# Patient Record
Sex: Female | Born: 1965 | Race: Asian | Hispanic: No | Marital: Single
Health system: Southern US, Community
[De-identification: ages and names within clinical notes are randomized; demographics above are authoritative.]

## PROBLEM LIST (undated history)

## (undated) DIAGNOSIS — I10 Essential (primary) hypertension: Secondary | ICD-10-CM

## (undated) DIAGNOSIS — G8929 Other chronic pain: Secondary | ICD-10-CM

## (undated) DIAGNOSIS — R109 Unspecified abdominal pain: Secondary | ICD-10-CM

---

## 2009-05-04 ENCOUNTER — Emergency Department: Payer: Self-pay | Admitting: Internal Medicine

## 2011-01-05 ENCOUNTER — Ambulatory Visit: Payer: Self-pay | Admitting: Internal Medicine

## 2011-01-16 ENCOUNTER — Ambulatory Visit: Payer: Self-pay | Admitting: Internal Medicine

## 2013-08-31 ENCOUNTER — Ambulatory Visit: Payer: Self-pay | Admitting: Family Medicine

## 2015-01-21 ENCOUNTER — Ambulatory Visit: Payer: Self-pay | Admitting: Family Medicine

## 2016-02-01 ENCOUNTER — Other Ambulatory Visit: Payer: Self-pay | Admitting: Family Medicine

## 2016-02-01 DIAGNOSIS — Z1231 Encounter for screening mammogram for malignant neoplasm of breast: Secondary | ICD-10-CM

## 2016-02-13 ENCOUNTER — Ambulatory Visit
Admission: RE | Admit: 2016-02-13 | Discharge: 2016-02-13 | Disposition: A | Discharge status: Home / Self Care | Payer: BLUE CROSS/BLUE SHIELD | Source: Ambulatory Visit | Attending: Family Medicine | Admitting: Family Medicine

## 2016-02-13 DIAGNOSIS — Z1231 Encounter for screening mammogram for malignant neoplasm of breast: Secondary | ICD-10-CM | POA: Diagnosis not present

## 2016-07-27 ENCOUNTER — Ambulatory Visit: Payer: BLUE CROSS/BLUE SHIELD | Admitting: Certified Registered Nurse Anesthetist

## 2016-07-27 ENCOUNTER — Encounter
Admission: RE | Disposition: A | Discharge status: Home / Self Care | Payer: Self-pay | Source: Ambulatory Visit | Attending: Gastroenterology

## 2016-07-27 ENCOUNTER — Encounter: Payer: Self-pay | Admitting: Anesthesiology

## 2016-07-27 ENCOUNTER — Ambulatory Visit
Admission: RE | Admit: 2016-07-27 | Discharge: 2016-07-27 | Disposition: A | Discharge status: Home / Self Care | Payer: BLUE CROSS/BLUE SHIELD | Source: Ambulatory Visit | Attending: Gastroenterology | Admitting: Gastroenterology

## 2016-07-27 DIAGNOSIS — I10 Essential (primary) hypertension: Secondary | ICD-10-CM | POA: Insufficient documentation

## 2016-07-27 DIAGNOSIS — Z1211 Encounter for screening for malignant neoplasm of colon: Secondary | ICD-10-CM | POA: Diagnosis not present

## 2016-07-27 DIAGNOSIS — K640 First degree hemorrhoids: Secondary | ICD-10-CM | POA: Diagnosis not present

## 2016-07-27 HISTORY — DX: Essential (primary) hypertension: I10

## 2016-07-27 HISTORY — DX: Other chronic pain: G89.29

## 2016-07-27 HISTORY — DX: Unspecified abdominal pain: R10.9

## 2016-07-27 HISTORY — PX: COLONOSCOPY WITH PROPOFOL: SHX5780

## 2016-07-27 SURGERY — COLONOSCOPY WITH PROPOFOL
Anesthesia: General

## 2016-07-27 MED ORDER — SODIUM CHLORIDE 0.9 % IV SOLN
INTRAVENOUS | Status: DC
  Administered 2016-07-27: 1000 mL via INTRAVENOUS

## 2016-07-27 MED ORDER — PROPOFOL 10 MG/ML IV BOLUS
INTRAVENOUS | Status: DC | PRN
  Administered 2016-07-27: 25 mg via INTRAVENOUS

## 2016-07-27 MED ORDER — MIDAZOLAM HCL 5 MG/5ML IJ SOLN
INTRAMUSCULAR | Status: DC | PRN
  Administered 2016-07-27: 1 mg via INTRAVENOUS

## 2016-07-27 MED ORDER — PROPOFOL 500 MG/50ML IV EMUL
INTRAVENOUS | Status: DC | PRN
  Administered 2016-07-27: 140 ug/kg/min via INTRAVENOUS

## 2016-07-27 MED ORDER — SODIUM CHLORIDE 0.9 % IV SOLN: INTRAVENOUS | Status: DC

## 2016-07-27 MED ORDER — FENTANYL CITRATE (PF) 100 MCG/2ML IJ SOLN
INTRAMUSCULAR | Status: DC | PRN
  Administered 2016-07-27: 50 ug via INTRAVENOUS

## 2016-07-27 NOTE — OR Nursing (Signed)
All pre-procedure questions and explanations were done with Simmaly Sinouanthavysouk, Sports coachLaotian Interpreter.

## 2016-07-27 NOTE — Anesthesia Preprocedure Evaluation (Signed)
Anesthesia Evaluation  Patient identified by MRN, date of birth, ID band Patient awake    Reviewed: Allergy & Precautions, H&P , NPO status , Patient's Chart, lab work & pertinent test results, reviewed documented beta blocker date and time   History of Anesthesia Complications Negative for: history of anesthetic complications  Airway Mallampati: I  TM Distance: >3 FB Neck ROM: full    Dental no notable dental hx. (+) Missing   Pulmonary neg pulmonary ROS,    Pulmonary exam normal breath sounds clear to auscultation       Cardiovascular Exercise Tolerance: Good hypertension, (-) angina(-) CAD, (-) Past MI, (-) Cardiac Stents and (-) CABG Normal cardiovascular exam(-) dysrhythmias (-) Valvular Problems/Murmurs Rhythm:regular Rate:Normal     Neuro/Psych negative neurological ROS  negative psych ROS   GI/Hepatic negative GI ROS, Neg liver ROS,   Endo/Other  negative endocrine ROS  Renal/GU negative Renal ROS  negative genitourinary   Musculoskeletal   Abdominal   Peds  Hematology negative hematology ROS (+)   Anesthesia Other Findings Past Medical History: No date: Chronic abdominal pain No date: Hypertension   Reproductive/Obstetrics negative OB ROS                             Anesthesia Physical Anesthesia Plan  ASA: II  Anesthesia Plan: General   Post-op Pain Management:    Induction:   Airway Management Planned:   Additional Equipment:   Intra-op Plan:   Post-operative Plan:   Informed Consent: I have reviewed the patients History and Physical, chart, labs and discussed the procedure including the risks, benefits and alternatives for the proposed anesthesia with the patient or authorized representative who has indicated his/her understanding and acceptance.   Dental Advisory Given  Plan Discussed with: Anesthesiologist, CRNA and Surgeon  Anesthesia Plan Comments:          Anesthesia Quick Evaluation

## 2016-07-27 NOTE — Op Note (Signed)
[facility] Gastroenterology Patient Name: Lauren Peterson Procedure Date: 07/27/2016 8:34 AM MRN: [id number] Account #: [id number] Date of Birth: 03-20-1966 Admit Type: Outpatient Age: 50 Room: [facility] ENDO ROOM 3 Gender: Female Note Status: Finalized Procedure:            Colonoscopy Indications:          Screening for colorectal malignant neoplasm, This is                        the patient's first colonoscopy Providers:            Christena Deem, MD Referring MD:         Marisue Ivan (Referring MD) Medicines:            Monitored Anesthesia Care Complications:        No immediate complications. Procedure:            Pre-Anesthesia Assessment:                       - ASA Grade Assessment: II - A patient with mild                        systemic disease.                       After obtaining informed consent, the colonoscope was                        passed under direct vision. Throughout the procedure,                        the patient's blood pressure, pulse, and oxygen                        saturations were monitored continuously. The                        Colonoscope was introduced through the anus and                        advanced to the the cecum, identified by appendiceal                        orifice and ileocecal valve. The colonoscopy was                        performed without difficulty. The patient tolerated the                        procedure well. The quality of the bowel preparation                        was good. Findings:      Non-bleeding internal hemorrhoids were found during anoscopy. The       hemorrhoids were small and Grade I (internal hemorrhoids that do not       prolapse).      The digital rectal exam was normal.      The entire examined colon appeared normal. Impression:           - Non-bleeding internal hemorrhoids.                       -  The entire examined colon is normal.                       - No  specimens collected. Recommendation:       - Repeat colonoscopy in 10 years for screening purposes. Procedure Code(s):    --- Professional ---                       [phone], Colonoscopy, flexible; diagnostic, including                        collection of specimen(s) by brushing or washing, when                        performed (separate procedure) Diagnosis Code(s):    --- Professional ---                       Z12.11, Encounter for screening for malignant neoplasm                        of colon                       K64.0, First degree hemorrhoids CPT copyright 2016 [employer]. All rights reserved. The codes documented in this report are preliminary and upon coder review may  be revised to meet current compliance requirements. Christena DeemMartin U Augustino Savastano, MD 07/27/2016 9:04:19 AM This report has been signed electronically. Number of Addenda: 0 Note Initiated On: 07/27/2016 8:34 AM Scope Withdrawal Time: 0 hours 5 minutes 54 seconds  Total Procedure Duration: 0 hours 12 minutes 12 seconds       [facility]

## 2016-07-27 NOTE — Transfer of Care (Signed)
Immediate Anesthesia Transfer of Care Note  Patient: Lauren Peterson  Procedure(s) Performed: Procedure(s): COLONOSCOPY WITH PROPOFOL (N/A)  Patient Location: PACU and Endoscopy Unit  Anesthesia Type:General  Level of Consciousness: awake, alert  and oriented  Airway & Oxygen Therapy: Patient Spontanous Breathing and Patient connected to nasal cannula oxygen  Post-op Assessment:   VSS, NAD, no anesthesia problems.  Post vital signs: Reviewed and stable  Last Vitals:  Vitals:   07/27/16 0749 07/27/16 0907  BP: (!) 134/94   Pulse: 79   Resp: 16   Temp: 36.5 C (!) 35.6 C    Last Pain:  Vitals:   07/27/16 0907  TempSrc: Tympanic         Complications: No apparent anesthesia complications

## 2016-07-27 NOTE — H&P (Signed)
Outpatient short stay form Pre-procedure 07/27/2016 8:34 AM Lauren DeemMartin U Skulskie MD  Primary Physician: Dr Lauren Peterson  Reason for visit:  Colon cancer screening  History of present illness:  Patient is a 50 year old female presenting as above. She tolerated her prep well. She takes no aspirin or blood thinning agents. She has no baseline symptoms today of abdominal pain. She has no diarrhea or rectal bleeding.    Current Facility-Administered Medications:  .  0.9 %  sodium chloride infusion, , Intravenous, Continuous, Lauren DeemMartin U Skulskie, MD, Last Rate: 20 mL/hr at 07/27/16 0831, 1,000 mL at 07/27/16 0831 .  0.9 %  sodium chloride infusion, , Intravenous, Continuous, Lauren DeemMartin U Skulskie, MD  Prescriptions Prior to Admission  Medication Sig Dispense Refill Last Dose  . hydrochlorothiazide (HYDRODIURIL) 25 MG tablet Take 25 mg by mouth daily.   07/27/2016 at 0555  . ibuprofen (ADVIL,MOTRIN) 200 MG tablet Take 200 mg by mouth every 6 (six) hours as needed.   Not Taking at Unknown time     No Known Allergies   Past Medical History:  Diagnosis Date  . Chronic abdominal pain   . Hypertension     Review of systems:      Physical Exam    Heart and lungs: Regular rate and rhythm without rub or gallop, lungs are bilaterally clear.    HEENT: Normocephalic atraumatic eyes are anicteric    Other:     Pertinant exam for procedure: Soft nontender nondistended bowel sounds positive normoactive.    Planned proceedures: Colonoscopy and indicated procedures. I have discussed the risks benefits and complications of procedures to include not limited to bleeding, infection, perforation and the risk of sedation and the patient wishes to proceed.    Lauren DeemMartin U Skulskie, MD Gastroenterology 07/27/2016  8:34 AM

## 2016-07-27 NOTE — Anesthesia Postprocedure Evaluation (Signed)
Anesthesia Post Note  Patient: Lauren Peterson  Procedure(s) Performed: Procedure(s) (LRB): COLONOSCOPY WITH PROPOFOL (N/A)  Patient location during evaluation: Endoscopy Anesthesia Type: General Level of consciousness: awake and alert Pain management: pain level controlled Vital Signs Assessment: post-procedure vital signs reviewed and stable Respiratory status: spontaneous breathing, nonlabored ventilation, respiratory function stable and patient connected to nasal cannula oxygen Cardiovascular status: blood pressure returned to baseline and stable Postop Assessment: no signs of nausea or vomiting Anesthetic complications: no    Last Vitals:  Vitals:   07/27/16 0927 07/27/16 0937  BP: (!) 110/97 (!) 154/97  Pulse: 67 64  Resp: 13 14  Temp:      Last Pain:  Vitals:   07/27/16 0907  TempSrc: Tympanic                 Lenard SimmerAndrew Tyrianna Lightle

## 2016-07-27 NOTE — Anesthesia Procedure Notes (Signed)
Date/Time: 07/27/2016 8:47 AM Performed by: Derinda LateIACONE, Jesseka Drinkard Pre-anesthesia Checklist: Patient identified, Emergency Drugs available, Suction available, Patient being monitored and Timeout performed Patient Re-evaluated:Patient Re-evaluated prior to inductionOxygen Delivery Method: Nasal cannula Preoxygenation: Pre-oxygenation with 100% oxygen Intubation Type: IV induction Placement Confirmation: positive ETCO2

## 2016-07-30 ENCOUNTER — Encounter: Payer: Self-pay | Admitting: Gastroenterology

## 2017-03-21 ENCOUNTER — Other Ambulatory Visit: Payer: Self-pay | Admitting: Family Medicine

## 2017-03-21 DIAGNOSIS — Z1231 Encounter for screening mammogram for malignant neoplasm of breast: Secondary | ICD-10-CM

## 2017-04-10 ENCOUNTER — Ambulatory Visit: Payer: BLUE CROSS/BLUE SHIELD

## 2017-05-20 ENCOUNTER — Ambulatory Visit
Admission: RE | Admit: 2017-05-20 | Discharge: 2017-05-20 | Disposition: A | Discharge status: Home / Self Care | Payer: BLUE CROSS/BLUE SHIELD | Source: Ambulatory Visit | Attending: Family Medicine | Admitting: Family Medicine

## 2017-05-20 DIAGNOSIS — Z1231 Encounter for screening mammogram for malignant neoplasm of breast: Secondary | ICD-10-CM | POA: Diagnosis present

## 2017-09-18 IMAGING — MG MM DIGITAL SCREENING BILAT W/ CAD
4 series · 4 of 4 positions shown · non-contrast
Comparison: Previous exam(s).

CLINICAL DATA: Screening.

EXAM:
DIGITAL SCREENING BILATERAL MAMMOGRAM WITH CAD

[R MLO]
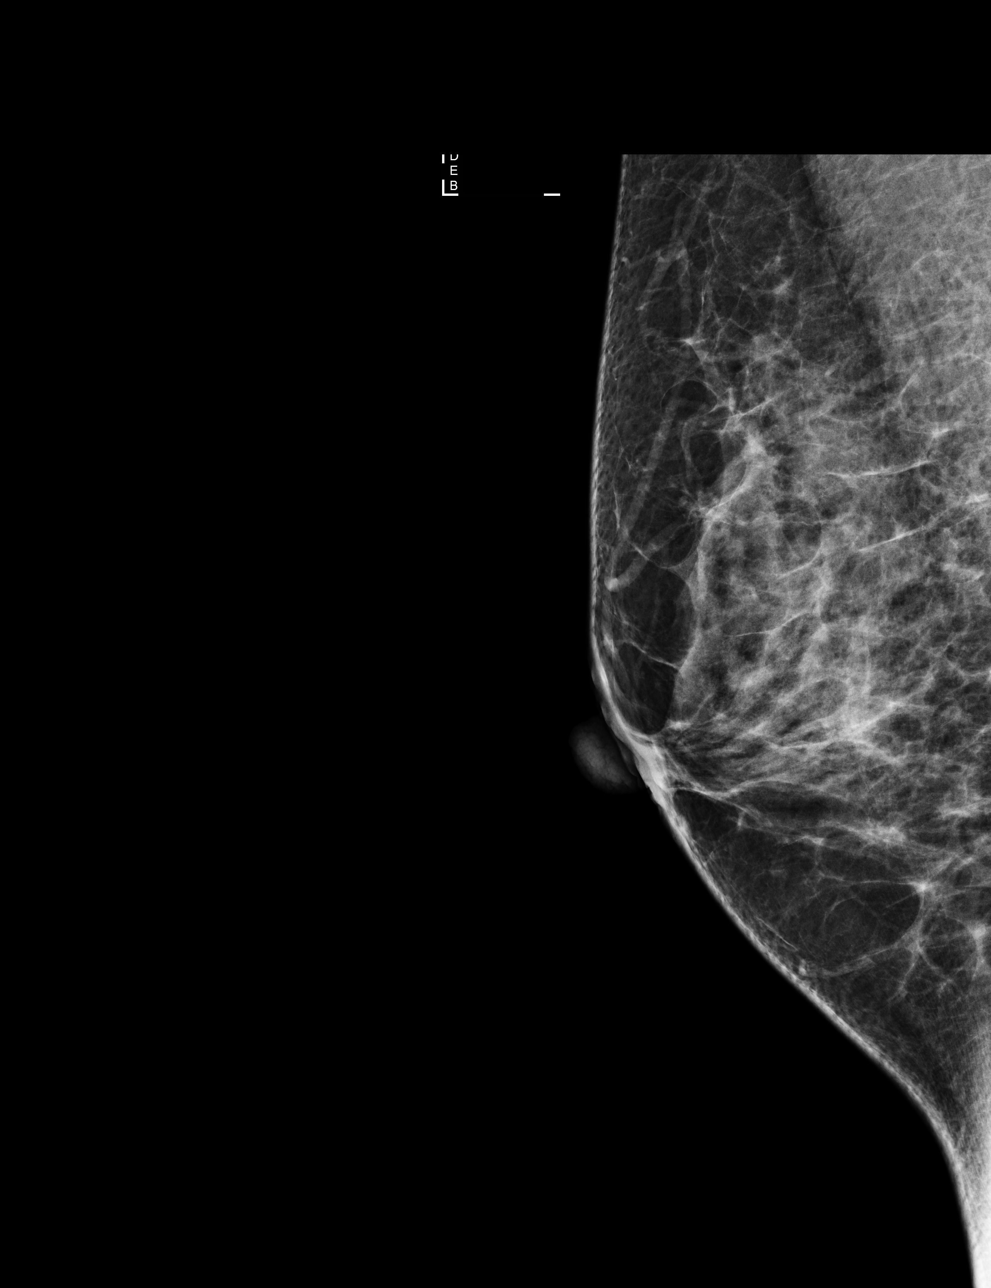

[R CC]
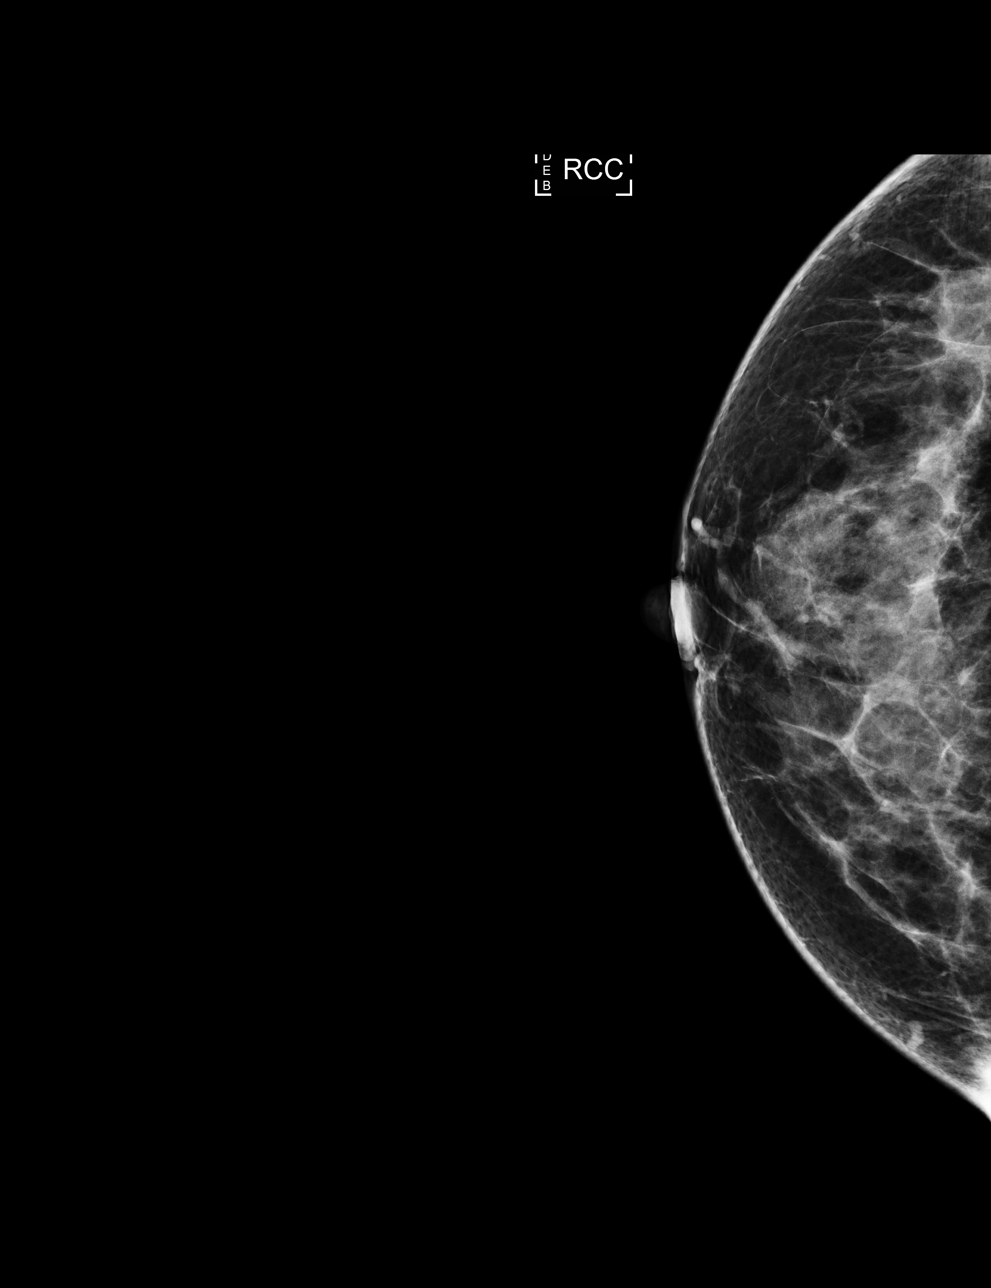

[L CC]
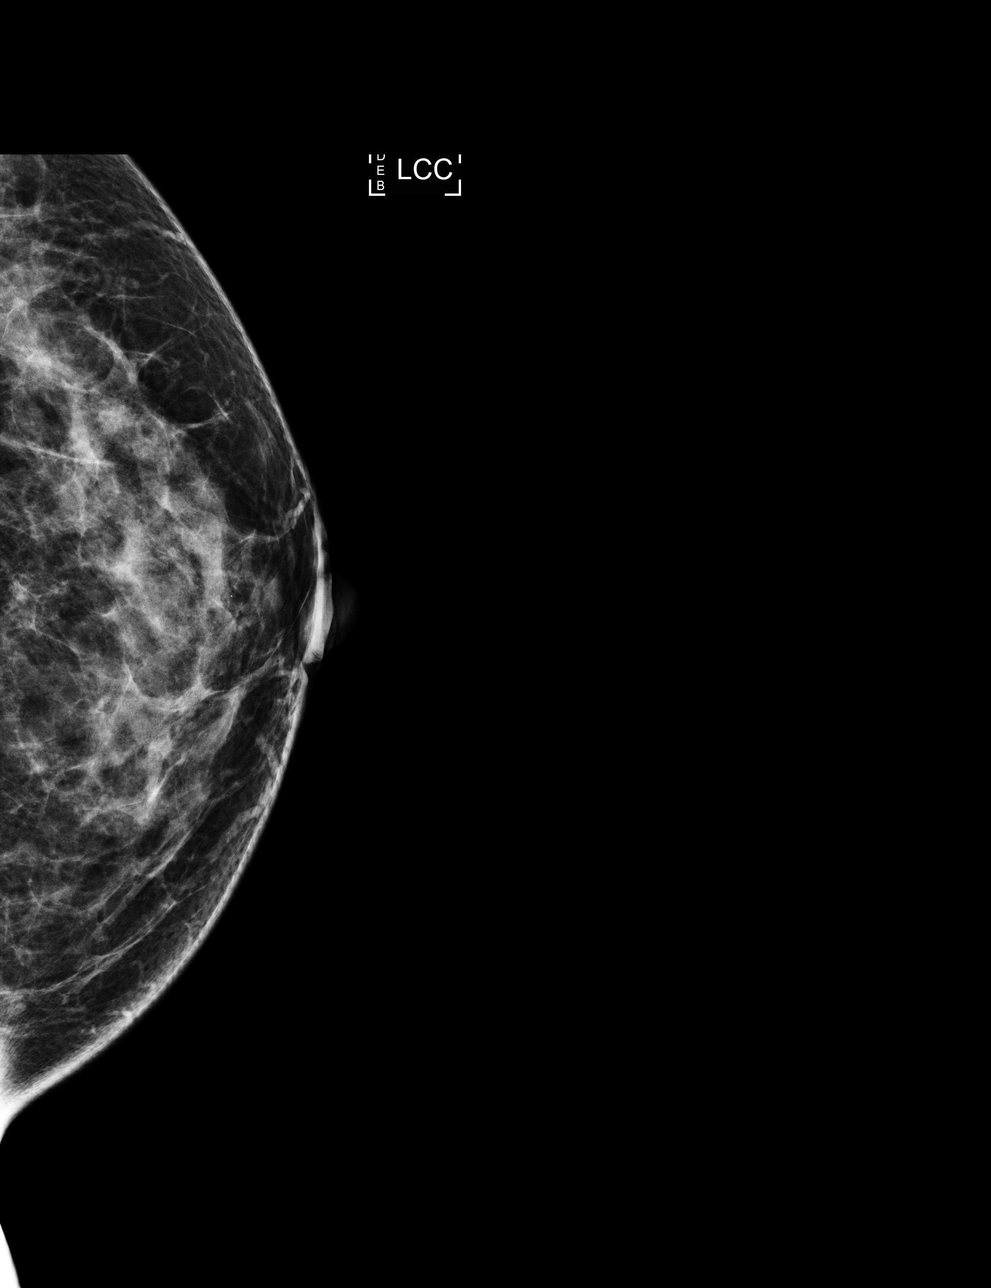

[L MLO]
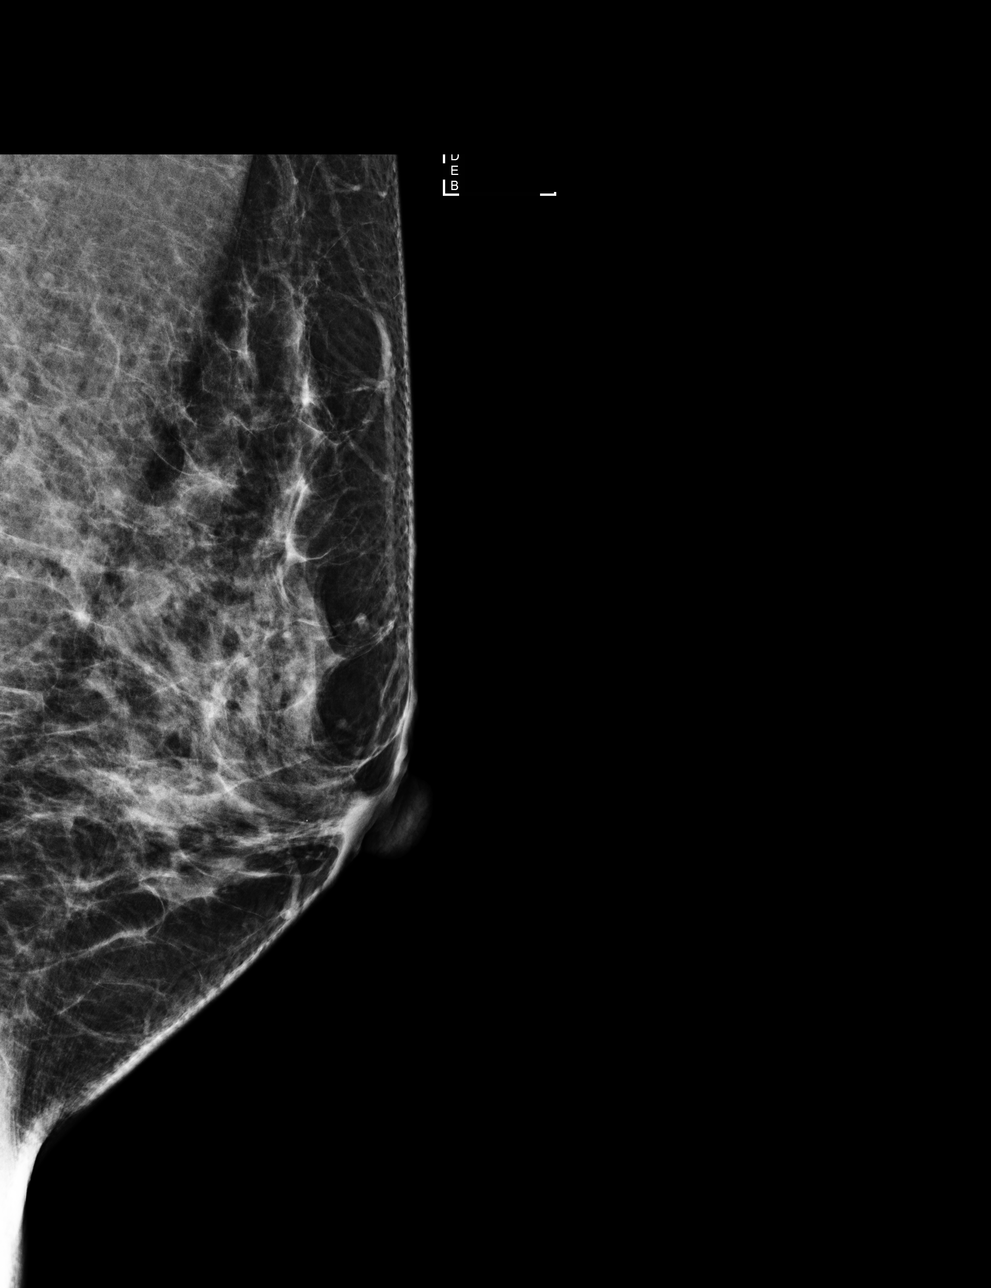

[4 of 4 positions shown; findings below may reference images not displayed]

ACR Breast Density Category c: The breast tissue is heterogeneously
dense, which may obscure small masses.
FINDINGS: There are no findings suspicious for malignancy. Images were
processed with CAD.
IMPRESSION: No mammographic evidence of malignancy. A result letter of this
screening mammogram will be mailed directly to the patient.

RECOMMENDATION:
Screening mammogram in one year. (Code:[id number])

BI-RADS CATEGORY  1: Negative.

## 2018-05-21 ENCOUNTER — Other Ambulatory Visit: Payer: Self-pay | Admitting: Family Medicine

## 2018-05-21 DIAGNOSIS — Z1231 Encounter for screening mammogram for malignant neoplasm of breast: Secondary | ICD-10-CM

## 2018-06-10 ENCOUNTER — Ambulatory Visit
Admission: RE | Admit: 2018-06-10 | Discharge: 2018-06-10 | Disposition: A | Discharge status: Home / Self Care | Payer: BLUE CROSS/BLUE SHIELD | Source: Ambulatory Visit | Attending: Family Medicine | Admitting: Family Medicine

## 2018-06-10 DIAGNOSIS — Z1231 Encounter for screening mammogram for malignant neoplasm of breast: Secondary | ICD-10-CM | POA: Insufficient documentation

## 2019-06-02 ENCOUNTER — Other Ambulatory Visit: Payer: Self-pay | Admitting: Family Medicine

## 2019-06-02 DIAGNOSIS — Z1231 Encounter for screening mammogram for malignant neoplasm of breast: Secondary | ICD-10-CM

## 2019-07-06 ENCOUNTER — Ambulatory Visit
Admission: RE | Admit: 2019-07-06 | Discharge: 2019-07-06 | Disposition: A | Discharge status: Home / Self Care | Payer: BC Managed Care – PPO | Source: Ambulatory Visit | Attending: Family Medicine | Admitting: Family Medicine

## 2019-07-06 DIAGNOSIS — Z1231 Encounter for screening mammogram for malignant neoplasm of breast: Secondary | ICD-10-CM | POA: Insufficient documentation

## 2020-07-26 ENCOUNTER — Other Ambulatory Visit: Payer: Self-pay | Admitting: Family Medicine

## 2020-07-26 DIAGNOSIS — Z1231 Encounter for screening mammogram for malignant neoplasm of breast: Secondary | ICD-10-CM

## 2020-08-18 ENCOUNTER — Ambulatory Visit
Admission: RE | Admit: 2020-08-18 | Discharge: 2020-08-18 | Disposition: A | Discharge status: Home / Self Care | Payer: BC Managed Care – PPO | Source: Ambulatory Visit | Attending: Family Medicine | Admitting: Family Medicine

## 2020-08-18 ENCOUNTER — Other Ambulatory Visit: Payer: Self-pay

## 2020-08-18 DIAGNOSIS — Z1231 Encounter for screening mammogram for malignant neoplasm of breast: Secondary | ICD-10-CM | POA: Insufficient documentation

## 2021-07-19 ENCOUNTER — Other Ambulatory Visit: Payer: Self-pay | Admitting: Family Medicine

## 2021-07-19 DIAGNOSIS — Z1231 Encounter for screening mammogram for malignant neoplasm of breast: Secondary | ICD-10-CM

## 2021-08-29 ENCOUNTER — Ambulatory Visit
Admission: RE | Admit: 2021-08-29 | Discharge: 2021-08-29 | Disposition: A | Discharge status: Home / Self Care | Payer: BC Managed Care – PPO | Source: Ambulatory Visit | Attending: Family Medicine | Admitting: Family Medicine

## 2021-08-29 ENCOUNTER — Other Ambulatory Visit: Payer: Self-pay

## 2021-08-29 DIAGNOSIS — Z1231 Encounter for screening mammogram for malignant neoplasm of breast: Secondary | ICD-10-CM | POA: Diagnosis present

## 2021-12-28 ENCOUNTER — Other Ambulatory Visit: Payer: Self-pay

## 2021-12-28 ENCOUNTER — Other Ambulatory Visit: Payer: Self-pay | Admitting: Family Medicine

## 2021-12-28 ENCOUNTER — Ambulatory Visit
Admission: RE | Admit: 2021-12-28 | Discharge: 2021-12-28 | Disposition: A | Discharge status: Home / Self Care | Payer: BC Managed Care – PPO | Source: Ambulatory Visit | Attending: Family Medicine | Admitting: Family Medicine

## 2021-12-28 DIAGNOSIS — M79604 Pain in right leg: Secondary | ICD-10-CM | POA: Insufficient documentation

## 2022-01-04 ENCOUNTER — Ambulatory Visit: Payer: BC Managed Care – PPO

## 2022-07-26 ENCOUNTER — Other Ambulatory Visit: Payer: Self-pay | Admitting: Family Medicine

## 2022-07-26 DIAGNOSIS — Z1231 Encounter for screening mammogram for malignant neoplasm of breast: Secondary | ICD-10-CM

## 2022-10-11 ENCOUNTER — Ambulatory Visit
Admission: RE | Admit: 2022-10-11 | Discharge: 2022-10-11 | Disposition: A | Discharge status: Home / Self Care | Payer: BC Managed Care – PPO | Source: Ambulatory Visit | Attending: Family Medicine | Admitting: Family Medicine

## 2022-10-11 DIAGNOSIS — Z1231 Encounter for screening mammogram for malignant neoplasm of breast: Secondary | ICD-10-CM | POA: Insufficient documentation

## 2023-10-08 ENCOUNTER — Other Ambulatory Visit: Payer: Self-pay | Admitting: Family Medicine

## 2023-10-08 DIAGNOSIS — Z1231 Encounter for screening mammogram for malignant neoplasm of breast: Secondary | ICD-10-CM

## 2023-11-07 ENCOUNTER — Ambulatory Visit
Admission: RE | Admit: 2023-11-07 | Discharge: 2023-11-07 | Disposition: A | Discharge status: Home / Self Care | Payer: BC Managed Care – PPO | Source: Ambulatory Visit | Attending: Family Medicine | Admitting: Family Medicine

## 2023-11-07 DIAGNOSIS — Z1231 Encounter for screening mammogram for malignant neoplasm of breast: Secondary | ICD-10-CM | POA: Insufficient documentation
